# Patient Record
Sex: Female | Born: 1940
Health system: Southern US, Community
[De-identification: ages and names within clinical notes are randomized; demographics above are authoritative.]

## PROBLEM LIST (undated history)

## (undated) DIAGNOSIS — I4891 Unspecified atrial fibrillation: Secondary | ICD-10-CM

## (undated) DIAGNOSIS — J40 Bronchitis, not specified as acute or chronic: Secondary | ICD-10-CM

## (undated) DIAGNOSIS — L309 Dermatitis, unspecified: Secondary | ICD-10-CM

## (undated) DIAGNOSIS — M810 Age-related osteoporosis without current pathological fracture: Secondary | ICD-10-CM

## (undated) DIAGNOSIS — B372 Candidiasis of skin and nail: Secondary | ICD-10-CM

## (undated) DIAGNOSIS — M79604 Pain in right leg: Secondary | ICD-10-CM

## (undated) DIAGNOSIS — I517 Cardiomegaly: Secondary | ICD-10-CM

## (undated) DIAGNOSIS — Z8709 Personal history of other diseases of the respiratory system: Secondary | ICD-10-CM

## (undated) DIAGNOSIS — J0130 Acute sphenoidal sinusitis, unspecified: Secondary | ICD-10-CM

## (undated) DIAGNOSIS — R059 Cough, unspecified: Secondary | ICD-10-CM

## (undated) DIAGNOSIS — J301 Allergic rhinitis due to pollen: Secondary | ICD-10-CM

## (undated) DIAGNOSIS — R42 Dizziness and giddiness: Secondary | ICD-10-CM

## (undated) DIAGNOSIS — L299 Pruritus, unspecified: Secondary | ICD-10-CM

## (undated) DIAGNOSIS — M8580 Other specified disorders of bone density and structure, unspecified site: Secondary | ICD-10-CM

## (undated) DIAGNOSIS — R7303 Prediabetes: Secondary | ICD-10-CM

## (undated) DIAGNOSIS — R0602 Shortness of breath: Secondary | ICD-10-CM

## (undated) DIAGNOSIS — R109 Unspecified abdominal pain: Secondary | ICD-10-CM

## (undated) DIAGNOSIS — R9431 Abnormal electrocardiogram [ECG] [EKG]: Secondary | ICD-10-CM

## (undated) DIAGNOSIS — E782 Mixed hyperlipidemia: Secondary | ICD-10-CM

## (undated) DIAGNOSIS — I340 Nonrheumatic mitral (valve) insufficiency: Secondary | ICD-10-CM

## (undated) DIAGNOSIS — I6523 Occlusion and stenosis of bilateral carotid arteries: Secondary | ICD-10-CM

## (undated) DIAGNOSIS — R300 Dysuria: Secondary | ICD-10-CM

## (undated) DIAGNOSIS — M79605 Pain in left leg: Secondary | ICD-10-CM

## (undated) DIAGNOSIS — G8929 Other chronic pain: Secondary | ICD-10-CM

## (undated) DIAGNOSIS — R946 Abnormal results of thyroid function studies: Secondary | ICD-10-CM

## (undated) DIAGNOSIS — R319 Hematuria, unspecified: Secondary | ICD-10-CM

## (undated) DIAGNOSIS — Z6826 Body mass index (BMI) 26.0-26.9, adult: Secondary | ICD-10-CM

## (undated) DIAGNOSIS — R8290 Unspecified abnormal findings in urine: Secondary | ICD-10-CM

## (undated) DIAGNOSIS — E559 Vitamin D deficiency, unspecified: Secondary | ICD-10-CM

## (undated) DIAGNOSIS — H8110 Benign paroxysmal vertigo, unspecified ear: Secondary | ICD-10-CM

## (undated) DIAGNOSIS — R21 Rash and other nonspecific skin eruption: Secondary | ICD-10-CM

## (undated) DIAGNOSIS — I493 Ventricular premature depolarization: Secondary | ICD-10-CM

## (undated) DIAGNOSIS — E7800 Pure hypercholesterolemia, unspecified: Secondary | ICD-10-CM

## (undated) DIAGNOSIS — I10 Essential (primary) hypertension: Secondary | ICD-10-CM

## (undated) DIAGNOSIS — R0989 Other specified symptoms and signs involving the circulatory and respiratory systems: Secondary | ICD-10-CM

## (undated) DIAGNOSIS — E663 Overweight: Secondary | ICD-10-CM

## (undated) DIAGNOSIS — R079 Chest pain, unspecified: Secondary | ICD-10-CM

## (undated) DIAGNOSIS — I739 Peripheral vascular disease, unspecified: Secondary | ICD-10-CM

## (undated) DIAGNOSIS — R5383 Other fatigue: Secondary | ICD-10-CM

## (undated) DIAGNOSIS — R350 Frequency of micturition: Secondary | ICD-10-CM

## (undated) DIAGNOSIS — R002 Palpitations: Secondary | ICD-10-CM

## (undated) HISTORY — DX: Unspecified abdominal pain: R10.9

## (undated) HISTORY — DX: Pure hypercholesterolemia, unspecified: E78.00

## (undated) HISTORY — DX: Allergic rhinitis due to pollen: J30.1

## (undated) HISTORY — DX: Essential (primary) hypertension: I10

## (undated) HISTORY — DX: Overweight: E66.3

## (undated) HISTORY — DX: Mixed hyperlipidemia: E78.2

## (undated) HISTORY — PX: SHOULDER SURGERY: SHX246

## (undated) HISTORY — DX: Other specified symptoms and signs involving the circulatory and respiratory systems: R09.89

## (undated) HISTORY — DX: Candidiasis of skin and nail: B37.2

## (undated) HISTORY — DX: Age-related osteoporosis without current pathological fracture: M81.0

## (undated) HISTORY — DX: Dysuria: R30.0

## (undated) HISTORY — DX: Vitamin D deficiency, unspecified: E55.9

## (undated) HISTORY — DX: Prediabetes: R73.03

## (undated) HISTORY — PX: TUBAL LIGATION: SHX77

## (undated) HISTORY — DX: Occlusion and stenosis of bilateral carotid arteries: I65.23

## (undated) HISTORY — DX: Acute sphenoidal sinusitis, unspecified: J01.30

## (undated) HISTORY — DX: Unspecified atrial fibrillation: I48.91

## (undated) HISTORY — DX: Pain in left leg: M79.604

## (undated) HISTORY — DX: Rash and other nonspecific skin eruption: R21

## (undated) HISTORY — DX: Other fatigue: R53.83

## (undated) HISTORY — DX: Cardiomegaly: I51.7

## (undated) HISTORY — DX: Pain in right leg: M79.605

## (undated) HISTORY — DX: Pruritus, unspecified: L29.9

## (undated) HISTORY — DX: Unspecified abnormal findings in urine: R82.90

## (undated) HISTORY — DX: Body mass index (BMI) 26.0-26.9, adult: Z68.26

## (undated) HISTORY — DX: Cough, unspecified: R05.9

## (undated) HISTORY — DX: Nonrheumatic mitral (valve) insufficiency: I34.0

## (undated) HISTORY — DX: Shortness of breath: R06.02

## (undated) HISTORY — DX: Other specified disorders of bone density and structure, unspecified site: M85.80

## (undated) HISTORY — DX: Hematuria, unspecified: R31.9

## (undated) HISTORY — DX: Personal history of other diseases of the respiratory system: Z87.09

## (undated) HISTORY — DX: Abnormal results of thyroid function studies: R94.6

## (undated) HISTORY — DX: Ventricular premature depolarization: I49.3

## (undated) HISTORY — PX: CHOLECYSTECTOMY: SHX55

## (undated) HISTORY — DX: Frequency of micturition: R35.0

## (undated) HISTORY — DX: Benign paroxysmal vertigo, unspecified ear: H81.10

## (undated) HISTORY — DX: Dizziness and giddiness: R42

## (undated) HISTORY — PX: CARPAL TUNNEL RELEASE: SHX101

## (undated) HISTORY — DX: Chest pain, unspecified: R07.9

## (undated) HISTORY — DX: Peripheral vascular disease, unspecified: I73.9

## (undated) HISTORY — DX: Hypomagnesemia: E83.42

## (undated) HISTORY — DX: Bronchitis, not specified as acute or chronic: J40

## (undated) HISTORY — DX: Dermatitis, unspecified: L30.9

## (undated) HISTORY — DX: Palpitations: R00.2

## (undated) HISTORY — DX: Abnormal electrocardiogram (ECG) (EKG): R94.31

---

## 2018-04-30 ENCOUNTER — Encounter (HOSPITAL_COMMUNITY): Payer: Self-pay

## 2018-04-30 ENCOUNTER — Ambulatory Visit (HOSPITAL_COMMUNITY): Admit: 2018-04-30 | Payer: Self-pay | Admitting: Orthopedic Surgery

## 2018-04-30 SURGERY — ARTHROPLASTY, KNEE, TOTAL
Anesthesia: Spinal | Laterality: Right

## 2018-11-11 ENCOUNTER — Other Ambulatory Visit: Payer: Self-pay

## 2018-11-11 ENCOUNTER — Emergency Department (HOSPITAL_BASED_OUTPATIENT_CLINIC_OR_DEPARTMENT_OTHER): Payer: Medicare Other

## 2018-11-11 ENCOUNTER — Emergency Department (HOSPITAL_BASED_OUTPATIENT_CLINIC_OR_DEPARTMENT_OTHER)
Admission: EM | Admit: 2018-11-11 | Discharge: 2018-11-11 | Disposition: A | Discharge status: Home / Self Care | Payer: Medicare Other | Attending: Emergency Medicine | Admitting: Emergency Medicine

## 2018-11-11 ENCOUNTER — Encounter (HOSPITAL_BASED_OUTPATIENT_CLINIC_OR_DEPARTMENT_OTHER): Payer: Self-pay | Admitting: Emergency Medicine

## 2018-11-11 DIAGNOSIS — Z7982 Long term (current) use of aspirin: Secondary | ICD-10-CM | POA: Insufficient documentation

## 2018-11-11 DIAGNOSIS — Y929 Unspecified place or not applicable: Secondary | ICD-10-CM | POA: Diagnosis not present

## 2018-11-11 DIAGNOSIS — M545 Low back pain, unspecified: Secondary | ICD-10-CM

## 2018-11-11 DIAGNOSIS — Y999 Unspecified external cause status: Secondary | ICD-10-CM | POA: Insufficient documentation

## 2018-11-11 DIAGNOSIS — Y939 Activity, unspecified: Secondary | ICD-10-CM | POA: Insufficient documentation

## 2018-11-11 DIAGNOSIS — W19XXXA Unspecified fall, initial encounter: Secondary | ICD-10-CM | POA: Insufficient documentation

## 2018-11-11 DIAGNOSIS — Z79899 Other long term (current) drug therapy: Secondary | ICD-10-CM | POA: Diagnosis not present

## 2018-11-11 DIAGNOSIS — S22080A Wedge compression fracture of T11-T12 vertebra, initial encounter for closed fracture: Secondary | ICD-10-CM | POA: Insufficient documentation

## 2018-11-11 DIAGNOSIS — S3992XA Unspecified injury of lower back, initial encounter: Secondary | ICD-10-CM | POA: Diagnosis present

## 2018-11-11 HISTORY — DX: Other chronic pain: G89.29

## 2018-11-11 MED ORDER — HYDROCODONE-ACETAMINOPHEN 5-325 MG PO TABS
1.0000 | ORAL_TABLET | Freq: Once | ORAL | Status: AC
  Administered 2018-11-11: 1 via ORAL
  Filled 2018-11-11: qty 1

## 2018-11-11 MED ORDER — CYCLOBENZAPRINE HCL 10 MG PO TABS: 10.0000 mg | ORAL_TABLET | Freq: Two times a day (BID) | ORAL | 0 refills | Status: DC | PRN

## 2018-11-11 MED FILL — CYCLOBENZAPRINE HCL 10 MG T: 10 | 10 days supply | Qty: 20 | Fill #0

## 2018-11-11 NOTE — ED Triage Notes (Signed)
Pt states she fell today.  Pt states she has bad knees, and she fell outside.  Pt hit her lower back.  Pt c/o back pain.

## 2018-11-11 NOTE — ED Provider Notes (Signed)
[facility]ERGENCY DEPARTMENT Provider Note   CSN: [id number] Arrival date & time: 11/11/18  1406     History   Chief Complaint Chief Complaint  Patient presents with   Fall    HPI Shristi Noto is a 78 y.o. female.     HPI   [address] while trying to go up stairs to daughter's apartment, happened around [employer] backwards and landed on back on cement prior to getting to the steps. No head impact, no headache, no LOC, no neck pain, no numbness/weakness  Severe lower back pain, has not had anything for it, does take vicodin for chronic knee pains 7AM, worse with movements. Extends into upper buttock pain.    Past Medical History:  Diagnosis Date   Knee pain, chronic     There are no active problems to display for this patient.   Past Surgical History:  Procedure Laterality Date   CHOLECYSTECTOMY     TUBAL LIGATION       OB History   No obstetric history on file.      Home Medications    Prior to Admission medications   Medication Sig Start Date End Date Taking? Authorizing Provider  albuterol (PROAIR HFA) 108 (90 Base) MCG/ACT inhaler  06/21/17  Yes [provider]  aspirin EC 81 MG tablet Frequency:   Dosage:0.0     Instructions:  Note: 05/13/09  Yes [provider]  atorvastatin (LIPITOR) 20 MG tablet atorvastatin 20 mg tablet 05/23/14  Yes [provider]  diclofenac sodium (VOLTAREN) 1 % GEL Apply topically. 03/22/16 12/04/18 Yes [provider]  lisinopril-hydrochlorothiazide (ZESTORETIC) 20-12.5 MG tablet lisinopril 20 mg-hydrochlorothiazide 12.5 mg tablet 05/23/14  Yes [provider]  Cholecalciferol (VITAMIN D-1000 MAX ST) 25 MCG (1000 UT) tablet Take by mouth.    [provider]  cyclobenzaprine (FLEXERIL) 10 MG tablet Take 1 tablet (10 mg total) by mouth 2 (two) times daily as needed for muscle spasms. 11/11/18   Gareth Morgan, MD  HYDROcodone-acetaminophen (NORCO) 10-325 MG tablet Take  by mouth. 11/24/18 01/23/19  [provider]  lisinopril-hydrochlorothiazide (ZESTORETIC) 20-12.5 MG tablet Take 1 tablet by mouth daily. 09/04/18   [provider]    Family History History reviewed. No pertinent family history.  Social History Social History   Tobacco Use   Smoking status: Never Smoker   Smokeless tobacco: Never Used  Substance Use Topics   Alcohol use: Not on file   Drug use: Not on file     Allergies   Patient has no known allergies.   Review of Systems Review of Systems  Constitutional: Negative for fever.  Eyes: Negative for visual disturbance.  Respiratory: Negative for cough.   Cardiovascular: Negative for chest pain.  Gastrointestinal: Negative for abdominal pain.  Genitourinary: Negative for difficulty urinating.  Musculoskeletal: Positive for back pain. Negative for neck pain.  Neurological: Negative for syncope, weakness, numbness and headaches.     Physical Exam Updated Vital Signs BP (!) 132/92    Pulse 73    Temp 97.8 F (36.6 C) (Oral)    Resp 16    Ht 5\' 4"  (1.626 m)    Wt 72.6 kg    SpO2 97%    BMI 27.46 kg/m   Physical Exam Vitals signs and nursing note reviewed.  Constitutional:      General: She is not in acute distress.    Appearance: She is well-developed. She is not diaphoretic.  HENT:     Head: Normocephalic  and atraumatic.  Eyes:     Conjunctiva/sclera: Conjunctivae normal.  Neck:     Musculoskeletal: Normal range of motion.  Cardiovascular:     Rate and Rhythm: Normal rate and regular rhythm.  Pulmonary:     Effort: Pulmonary effort is normal. No respiratory distress.  Musculoskeletal:     Cervical back: She exhibits no tenderness and no bony tenderness.     Thoracic back: She exhibits no tenderness and no bony tenderness.     Lumbar back: She exhibits tenderness and bony tenderness.     Comments: Tenderness extending to sacrum and bilat iliac crest area   Skin:    General: Skin is warm and  dry.     Findings: No erythema or rash.  Neurological:     Mental Status: She is alert and oriented to person, place, and time.     Comments: Normal strength/sensation lower extremities      ED Treatments / Results  Labs (all labs ordered are listed, but only abnormal results are displayed) Labs Reviewed - No data to display  EKG None  Radiology Dg Lumbar Spine Complete  Result Date: 11/11/2018 CLINICAL DATA:  Pain status post fall EXAM: LUMBAR SPINE - COMPLETE 4+ VIEW COMPARISON:  None. FINDINGS: There is age-indeterminate height loss of the T12 vertebral body. Multilevel degenerative changes are noted throughout the lumbar spine, greatest at the lower lumbar segments were there is moderate facet arthrosis. Vascular calcifications are noted. IMPRESSION: 1. Age-indeterminate height loss of the T12 vertebral body. Findings are concerning for compression fracture in should be correlated with point tenderness. 2. Multilevel degenerative changes in the lumbar spine, greatest at the lower lumbar segments. Electronically Signed   By: Katherine Mantlehristopher  Green M.D.   On: 11/11/2018 16:51   Dg Pelvis 1-2 Views  Result Date: 11/11/2018 CLINICAL DATA:  Fall, pelvic L-spine pain EXAM: PELVIS - 1-2 VIEW COMPARISON:  None. FINDINGS: The osseous structures appear diffusely demineralized which may limit detection of small or nondisplaced fractures. No acute bony abnormality. Specifically, no fracture, subluxation, or dislocation. Mild degenerative changes in the spine, SI joints and both hips. Phleboliths in the pelvis. No obstructive bowel gas pattern. Soft tissues are otherwise unremarkable. IMPRESSION: The osseous structures appear diffusely demineralized which may limit detection of small or nondisplaced fractures. No acute osseous abnormality. Electronically Signed   By: Kreg ShropshirePrice  DeHay M.D.   On: 11/11/2018 16:51    Procedures Procedures (including critical care time)  Medications Ordered in  ED Medications  HYDROcodone-acetaminophen (NORCO/VICODIN) 5-325 MG per tablet 1 tablet (1 tablet Oral Given 11/11/18 1551)     Initial Impression / Assessment and Plan / ED Course  I have reviewed the triage vital signs and the nursing notes.  Pertinent labs & imaging results that were available during my care of the patient were reviewed by me and considered in my medical decision making (see chart for details).        78 year old female with a history of hypertension, knee pain, presents with concern for mechanical fall with back pain.  Is not on anticoagulation, no head trauma, no headaches, have low suspicion for intracranial injury.  No neck pain.   No numbness or weakness, doubt spinal cord injury, CVA.  Reports pain in her lower back and some pelvic pain, XR obtained shows age indeterminate T12 fracture. She is able to ambulate/bear weight, doubt occult pelvic fx at this time. Most of her significant tenderness is located lower than this(L5)  however she does have some  tenderness beginning in this area and it is possible this is acute.  Given osteoporotic compression fx, age indeterminate, do not feel there will be significant benefit to bracing.  Pt had rx for norco, reports she is only taking BID and it is written for TID, recommend increasing frequency for pain and discussing further pain control with PCP. Given number for spinal surgery for follow up. Patient discharged in stable condition with understanding of reasons to return.    Final Clinical Impressions(s) / ED Diagnoses   Final diagnoses:  Fall, initial encounter  Acute bilateral low back pain without sciatica  Compression fracture of T12 vertebra, initial encounter [facility])    ED Discharge Orders         Ordered    cyclobenzaprine (FLEXERIL) 10 MG tablet  2 times daily PRN     11/11/18 1705           Alvira Monday, MD 11/12/18 [phone]

## 2018-11-11 NOTE — Discharge Instructions (Signed)
Your XR shows an age indeterminate compression fracture of T12.  You hare having some pain in this area, however you are having more significant pain lower and it is not clear if this fracture is new or old. In the setting of new back pain and some tenderness to this area, however, I cannot rule out that this is a new fracture.  You have been prescribed norco by your physician and have been taking twice a day rather than 3 times daily, and feel it is reasonable to increase frequency and discuss further pain control with your primary care physician and follow up with the spine doctors. I also recommend the prescribed muscle relaxant as there is likely a muscular component of your pain as well.

## 2018-11-11 NOTE — ED Notes (Signed)
ED Provider at bedside. 

## 2020-02-16 IMAGING — DX DG PELVIS 1-2V
1 series · 1 of 1 positions shown · non-contrast
Comparison: None.

CLINICAL DATA: Fall, pelvic L-spine pain

EXAM:
PELVIS - 1-2 VIEW

[pelvis ap]
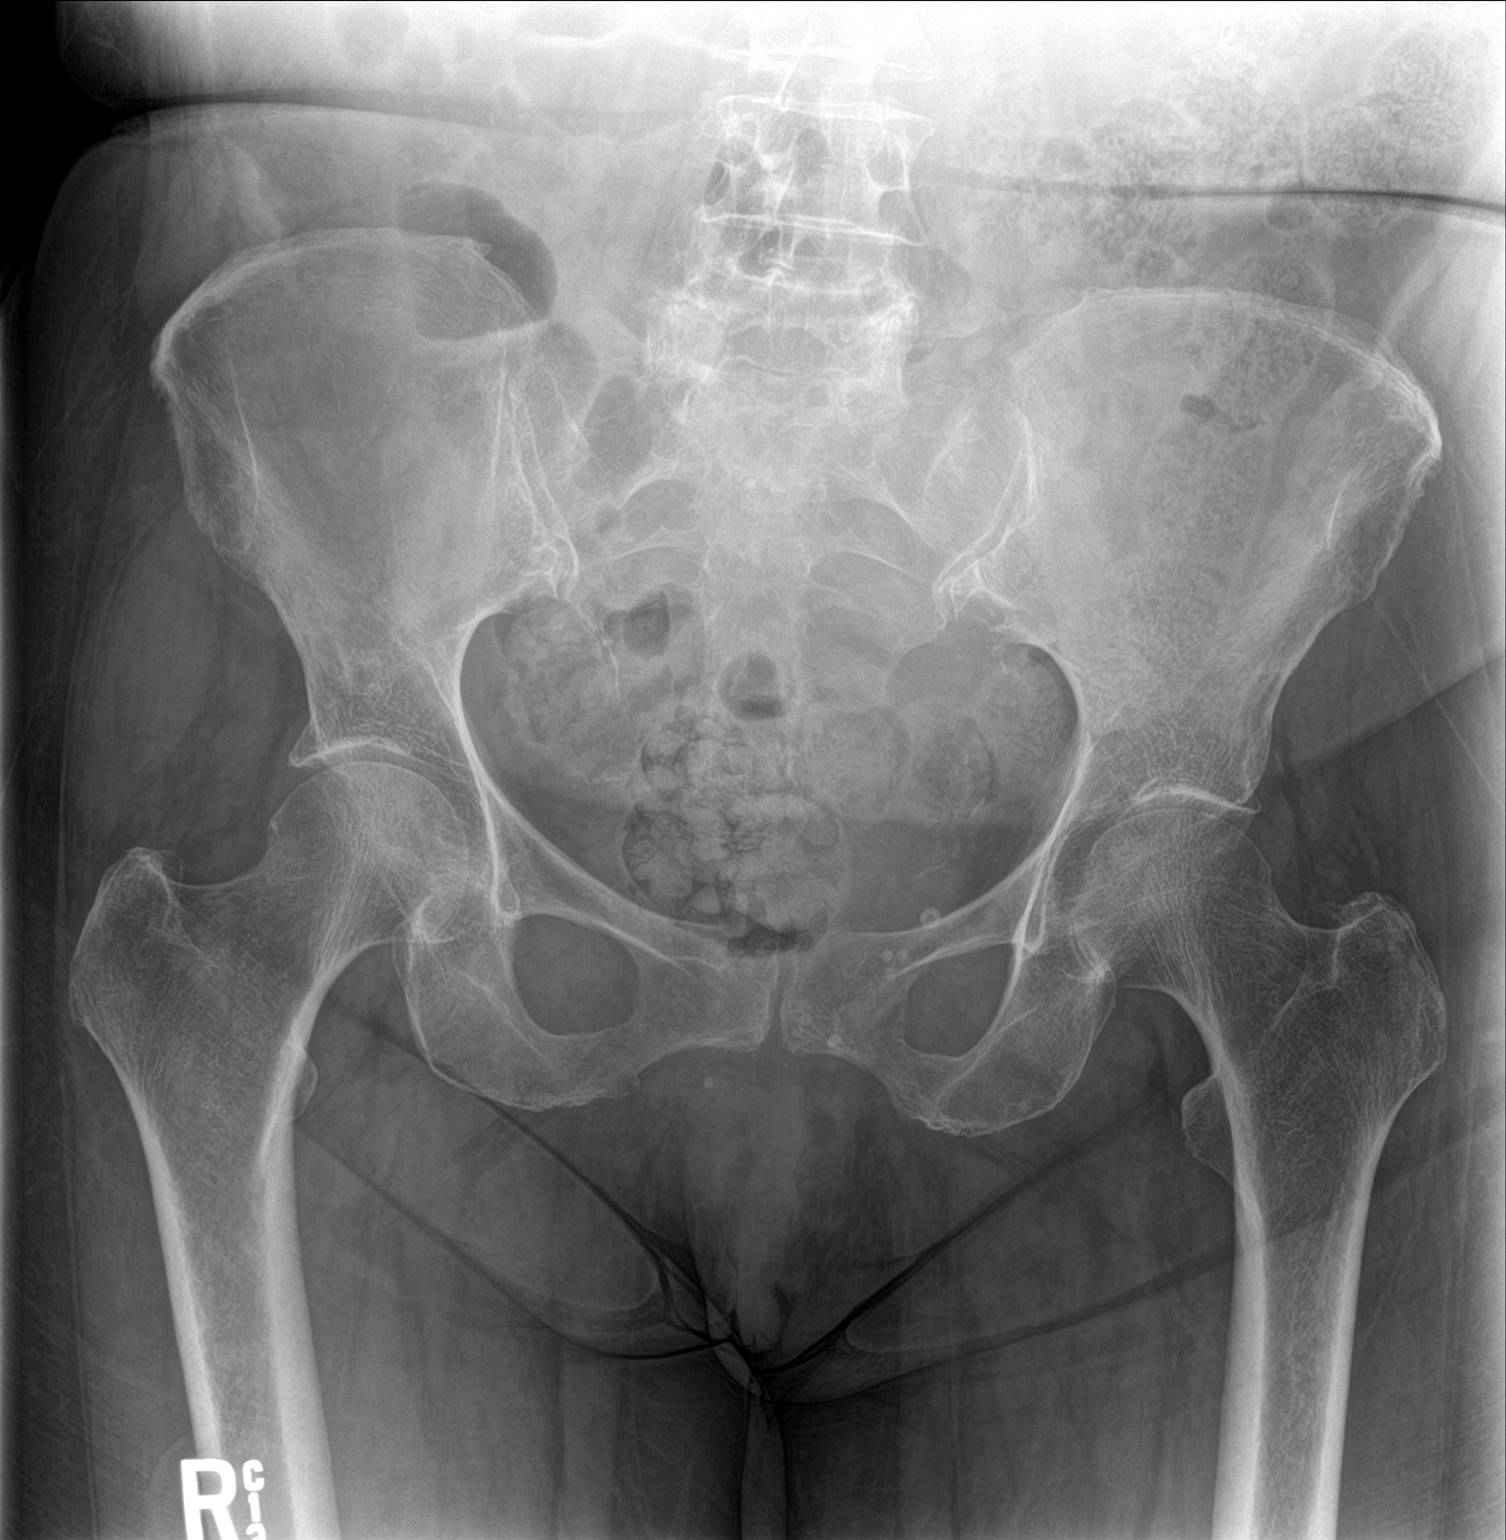

[1 of 1 positions shown; findings below may reference images not displayed]

FINDINGS: The osseous structures appear diffusely demineralized which may
limit detection of small or nondisplaced fractures. No acute bony
abnormality. Specifically, no fracture, subluxation, or dislocation.
Mild degenerative changes in the spine, SI joints and both hips.
Phleboliths in the pelvis. No obstructive bowel gas pattern. Soft
tissues are otherwise unremarkable.
IMPRESSION: The osseous structures appear diffusely demineralized which may
limit detection of small or nondisplaced fractures.

No acute osseous abnormality.

## 2022-05-17 ENCOUNTER — Encounter: Payer: Self-pay | Admitting: Cardiovascular Disease

## 2022-05-17 ENCOUNTER — Ambulatory Visit: Payer: Medicare Other | Attending: Cardiovascular Disease | Admitting: Cardiovascular Disease

## 2022-05-17 VITALS — BP 128/82 | HR 67 | Ht 64.0 in | Wt 151.2 lb

## 2022-05-17 DIAGNOSIS — I4819 Other persistent atrial fibrillation: Secondary | ICD-10-CM

## 2022-05-17 NOTE — Patient Instructions (Signed)
Medication Instructions:  Your physician recommends that you continue on your current medications as directed. Please refer to the Current Medication list given to you today.  *If you need a refill on your cardiac medications before your next appointment, please call your pharmacy*   Lab Work: CBC and BMET TODAY If you have labs (blood work) drawn today and your tests are completely normal, you will receive your results only by: MyChart Message (if you have MyChart) OR A paper copy in the mail If you have any lab test that is abnormal or we need to change your treatment, we will call you to review the results.   Testing/Procedures: Cardioversion - waiting on labs to review possible dose increase of Eliquis - then cardioversion in 1 month after dose increase if possible  Your physician has recommended that you have a Cardioversion (DCCV). Electrical Cardioversion uses a jolt of electricity to your heart either through paddles or wired patches attached to your chest. This is a controlled, usually prescheduled, procedure. Defibrillation is done under light anesthesia in the hospital, and you usually go home the day of the procedure. This is done to get your heart back into a normal rhythm. You are not awake for the procedure. Please see the instruction sheet given to you today.    Follow-Up: At [facility], you and your health needs are our priority.  As part of our continuing mission to provide you with exceptional heart care, we have created designated Provider Care Teams.  These Care Teams include your primary Cardiologist (physician) and Advanced Practice Providers (APPs -  Physician Assistants and Nurse Practitioners) who all work together to provide you with the care you need, when you need it.  We recommend signing up for the patient portal called "MyChart".  Sign up information is provided on this After Visit Summary.  MyChart is used to connect with patients for Virtual Visits  (Telemedicine).  Patients are able to view lab/test results, encounter notes, upcoming appointments, etc.  Non-urgent messages can be sent to your provider as well.   To learn more about what you can do with MyChart, go to [email or web address].    Your next appointment:   After cardioversion  Provider:   York Pellant, MD

## 2022-05-17 NOTE — Progress Notes (Signed)
Electrophysiology Office Note:    Date:  05/17/2022   ID:  Anna Roy, DOB 02/18/1940, MRN [id number]  PCP:  Caffie Damme, MD   [facility] Providers Cardiologist:  None     Referring MD: Sherrill Raring, MD   History of Present Illness:    Anna Roy is a 82 y.o. female with a hx listed below, significant for hypertension and hyperlipidemia, referred for arrhythmia management.  She was seen by her PCP for an annual checkup in January.  EKG at that time showed atrial fibrillation.  She recalls it had been approximately a year since her prior physician visit.  Patient was referred to cardiology and saw Dr. Hanley Hays with [facility] integrative cardiology in [facility].  Performed an echocardiogram, month-long monitor, and stress test.  There was no underlying structural heart disease detected other than that her left atrium is severely dilated.  I do not have the final report from the monitor, just a few transmissions with brief pauses in atrial fibrillation.  The patient is completely unaware of her atrial fibrillation.  She has not noticed onset of or increased fatigue.  She does not have palpitations.  She has not had any episodes of lightheadedness or presyncope.     Past Medical History:  Diagnosis Date   Abdominal pain    Abnormal EKG    Abnormal thyroid function test    Abnormal urine    Acute sphenoidal sinusitis, recurrence not specified    Allergic rhinitis due to pollen, unspecified seasonality    Atrial fibrillation by electrocardiogram (HCC)    BMI 26.0-26.9,adult    BPPV (benign paroxysmal positional vertigo)    Bronchitis    Bruit    Candidal intertrigo    Cardiomegaly    Carotid stenosis, bilateral    Chest pain    Cough    Dermatitis    Dizziness and giddiness    Dysuria    Fatigue    Hematuria    History of influenza    HTN (hypertension)    Hypercholesteremia    Hypomagnesemia    Knee pain, chronic    Leg pain, bilateral    LVH (left  ventricular hypertrophy)    Mitral regurgitation    Mixed hyperlipidemia    Osteopenia    Osteoporosis    Overweight    Palpitation    Prediabetes    Pruritus    PVC (premature ventricular contraction)    PVD (peripheral vascular disease) (HCC)    Rash    SOB (shortness of breath) on exertion    Urinary frequency    Vitamin D deficiency    Vitamin D deficiency     Past Surgical History:  Procedure Laterality Date   CARPAL TUNNEL RELEASE     CHOLECYSTECTOMY     SHOULDER SURGERY     TUBAL LIGATION      Current Medications: Current Meds  Medication Sig   apixaban (ELIQUIS) 2.5 MG TABS tablet Take by mouth 2 (two) times daily.   aspirin EC 81 MG tablet Frequency:   Dosage:0.0     Instructions:  Note:   atorvastatin (LIPITOR) 20 MG tablet atorvastatin 20 mg tablet   Cholecalciferol (VITAMIN D-1000 MAX ST) 25 MCG (1000 UT) tablet Take by mouth.   [facility] LORATADINE PO Take 10 mg by mouth daily as needed.   diclofenac Sodium (VOLTAREN ARTHRITIS PAIN) 1 % GEL Apply 2 g topically 4 (four) times daily.   fluticasone (FLONASE) 50 MCG/ACT nasal spray Place 1 spray  into both nostrils daily.   HYDROcodone-acetaminophen (NORCO) 10-325 MG tablet Take 1 tablet by mouth in the morning, at noon, and at bedtime.   lisinopril-hydrochlorothiazide (ZESTORETIC) 20-12.5 MG tablet lisinopril 20 mg-hydrochlorothiazide 12.5 mg tablet   lisinopril-hydrochlorothiazide (ZESTORETIC) 20-12.5 MG tablet Take 1 tablet by mouth daily.   Vitamin D, Ergocalciferol, (DRISDOL) 1.25 MG (50000 UNIT) CAPS capsule Take 50,000 Units by mouth every 7 (seven) days.   [DISCONTINUED] albuterol (PROAIR HFA) 108 (90 Base) MCG/ACT inhaler    [DISCONTINUED] cyclobenzaprine (FLEXERIL) 10 MG tablet Take 1 tablet (10 mg total) by mouth 2 (two) times daily as needed for muscle spasms.   [DISCONTINUED] gabapentin (NEURONTIN) 300 MG capsule Take 300 mg by mouth at bedtime.   [DISCONTINUED] Magnesium Oxide 250 MG TABS Take by mouth.    [DISCONTINUED] tizanidine (ZANAFLEX) 2 MG capsule Take 2 mg by mouth 3 (three) times daily as needed for muscle spasms.     Allergies:   Patient has no known allergies.   Social and Family History: Reviewed in Epic  ROS:   Please see the history of present illness.    All other systems reviewed and are negative.  EKGs/Labs/Other Studies Reviewed Today:    Echocardiogram:  TTE - per [facility] referral records EF 65-70%, left atrium is markedly dilated, 5.1 cm, otherwise normal anatomy and function   Monitors:  MCT  2/19-3/19, 2024 The patient has brought print outs of a few critical events from the monitor that show AF and pauses of up to 3.5 seconds occurring in the AM; one occurred at 7:45. The others before 5 AM.  Stress testing:   Advanced imaging:    EKG:  Last EKG results: today - atrial fibrillation with controlled ventricular rates   Recent Labs: No results found for requested labs within last 365 days.     Physical Exam:    VS:  BP 128/82   Pulse 67   Ht 5\' 4"  (1.626 m)   Wt 151 lb 3.2 oz (68.6 kg)   SpO2 95%   BMI 25.95 kg/m     Wt Readings from Last 3 Encounters:  05/17/22 151 lb 3.2 oz (68.6 kg)  11/11/18 160 lb (72.6 kg)     GEN: Well nourished, well developed in no acute distress CARDIAC: iRRR, no murmurs, rubs, gallops RESPIRATORY:  Normal work of breathing MUSCULOSKELETAL: no edema    ASSESSMENT & PLAN:    Atrial fibrillation - persistent No palpitations.  Rates are controlled Atrium is markedly dilated; AF is fairly fine on ECG I suspect she has had AF for much longer than has been appreciated I think a trial of sinus rhythm is reasonable, since she has never had an attempted cardioversion.   Secondary hypercoagulable state Will check renal function. Unless her creatinine is > 1.5, she should be on 5mg  of Eliquis BID She will need to be adequately anticoagulated for 3 weeks prior to cardioversion  Brief  pauses Pauses in AF are < 6 seconds, and the patient has not had symptoms of bradycardia No indication for pacemaker at this time.           Medication Adjustments/Labs and Tests Ordered: Current medicines are reviewed at length with the patient today.  Concerns regarding medicines are outlined above.  Orders Placed This Encounter  Procedures   CBC   Basic metabolic panel   EKG 12-Lead   No orders of the defined types were placed in this encounter.    Signed, Roberts Gaudy Trellis Guirguis,  MD  05/17/2022 4:52 PM    [facility]

## 2022-05-18 LAB — BASIC METABOLIC PANEL
BUN/Creatinine Ratio: 13 (ref 12–28)
BUN: 8 mg/dL (ref 8–27)
CO2: 24 mmol/L (ref 20–29)
Calcium: 10.1 mg/dL (ref 8.7–10.3)
Chloride: 103 mmol/L (ref 96–106)
Creatinine, Ser: 0.64 mg/dL (ref 0.57–1.00)
Glucose: 91 mg/dL (ref 70–99)
Potassium: 3.8 mmol/L (ref 3.5–5.2)
Sodium: 142 mmol/L (ref 134–144)
eGFR: 88 mL/min/{1.73_m2} (ref >59)

## 2022-05-18 LAB — CBC
Hematocrit: 39.2 % (ref 34.0–46.6)
Hemoglobin: 12.8 g/dL (ref 11.1–15.9)
MCH: 29.3 pg (ref 26.6–33.0)
MCHC: 32.7 g/dL (ref 31.5–35.7)
MCV: 90 fL (ref 79–97)
Platelets: 232 10*3/uL (ref 150–450)
RBC: 4.37 x10E6/uL (ref 3.77–5.28)
RDW: 12.8 % (ref 11.7–15.4)
WBC: 6.7 10*3/uL (ref 3.4–10.8)

## 2022-05-19 ENCOUNTER — Telehealth: Payer: Self-pay

## 2022-05-19 MED ORDER — APIXABAN 5 MG PO TABS: 5.0000 mg | ORAL_TABLET | Freq: Two times a day (BID) | ORAL | 11 refills | Status: AC

## 2022-05-19 NOTE — Telephone Encounter (Signed)
Left message to call back - labs look good, need to increase Eliquis (see Dr Zoe Lan note from last office visit) - also needs to be scheduled for DCCV 1 month after increase, confirm dates available

## 2022-05-19 NOTE — Telephone Encounter (Signed)
Spoke with patients daughter Harriett Sine, [place] on file), lab work looks good, sent in Eliquis 5mg  prescription to [facility] in [place] as requested, scheduled cardioversion for 06/22/22.    Per Dr. Morrie Sheldon note from office visit on 05/17/22 Will check renal function. Unless her creatinine is > 1.5, she should be on 5mg  of Eliquis BID She will need to be adequately anticoagulated for 3 weeks prior to cardioversion

## 2022-06-16 ENCOUNTER — Ambulatory Visit: Payer: Medicare Other | Attending: Cardiovascular Disease | Admitting: Cardiovascular Disease

## 2022-06-16 ENCOUNTER — Encounter: Payer: Self-pay | Admitting: Cardiovascular Disease

## 2022-06-16 VITALS — BP 110/80 | HR 80 | Ht 64.0 in | Wt 150.8 lb

## 2022-06-16 DIAGNOSIS — I4891 Unspecified atrial fibrillation: Secondary | ICD-10-CM | POA: Diagnosis not present

## 2022-06-16 NOTE — Progress Notes (Signed)
Electrophysiology Office Note:    Date:  06/16/2022   ID:  Anna Roy, DOB 1940-12-26, MRN [id number]  PCP:  Caffie Damme, MD   [facility] Providers Cardiologist:  None     Referring MD: Caffie Damme, MD   History of Present Illness:    Anna Roy is a 82 y.o. female with a hx listed below, significant for hypertension and hyperlipidemia, referred for arrhythmia management.  She was seen by her PCP for an annual checkup in January.  EKG at that time showed atrial fibrillation.  She recalls it had been approximately a year since her prior physician visit.  Patient was referred to cardiology and saw Dr. Hanley Hays with [facility] integrative cardiology in [facility].  Performed an echocardiogram, month-long monitor, and stress test.  There was no underlying structural heart disease detected other than that her left atrium is severely dilated.  I do not have the final report from the monitor, just a few transmissions with brief pauses in atrial fibrillation.  The patient is completely unaware of her atrial fibrillation.  She has not noticed onset of or increased fatigue.  She does not have palpitations.  She has not had any episodes of lightheadedness or presyncope.  She is here for a pre-procedure visit. She is scheduled for cardioversion on June 6. She has not had any changes in diagnoses, symptoms, or medications.   Past Medical History:  Diagnosis Date   Abdominal pain    Abnormal EKG    Abnormal thyroid function test    Abnormal urine    Acute sphenoidal sinusitis, recurrence not specified    Allergic rhinitis due to pollen, unspecified seasonality    Atrial fibrillation by electrocardiogram (HCC)    BMI 26.0-26.9,adult    BPPV (benign paroxysmal positional vertigo)    Bronchitis    Bruit    Candidal intertrigo    Cardiomegaly    Carotid stenosis, bilateral    Chest pain    Cough    Dermatitis    Dizziness and giddiness    Dysuria    Fatigue    Hematuria     History of influenza    HTN (hypertension)    Hypercholesteremia    Hypomagnesemia    Knee pain, chronic    Leg pain, bilateral    LVH (left ventricular hypertrophy)    Mitral regurgitation    Mixed hyperlipidemia    Osteopenia    Osteoporosis    Overweight    Palpitation    Prediabetes    Pruritus    PVC (premature ventricular contraction)    PVD (peripheral vascular disease) (HCC)    Rash    SOB (shortness of breath) on exertion    Urinary frequency    Vitamin D deficiency    Vitamin D deficiency     Past Surgical History:  Procedure Laterality Date   CARPAL TUNNEL RELEASE     CHOLECYSTECTOMY     SHOULDER SURGERY     TUBAL LIGATION      Current Medications: Current Meds  Medication Sig   apixaban (ELIQUIS) 5 MG TABS tablet Take 1 tablet (5 mg total) by mouth 2 (two) times daily.   aspirin EC 81 MG tablet Frequency:   Dosage:0.0     Instructions:  Note:   atorvastatin (LIPITOR) 20 MG tablet atorvastatin 20 mg tablet   Cholecalciferol (VITAMIN D-1000 MAX ST) 25 MCG (1000 UT) tablet Take by mouth.   [employer] LORATADINE PO Take 10 mg by mouth daily as needed.  diclofenac Sodium (VOLTAREN ARTHRITIS PAIN) 1 % GEL Apply 2 g topically 4 (four) times daily.   fluticasone (FLONASE) 50 MCG/ACT nasal spray Place 1 spray into both nostrils daily.   HYDROcodone-acetaminophen (NORCO) 10-325 MG tablet Take 1 tablet by mouth in the morning, at noon, and at bedtime.   lisinopril-hydrochlorothiazide (ZESTORETIC) 20-12.5 MG tablet lisinopril 20 mg-hydrochlorothiazide 12.5 mg tablet   lisinopril-hydrochlorothiazide (ZESTORETIC) 20-12.5 MG tablet Take 1 tablet by mouth daily.   Vitamin D, Ergocalciferol, (DRISDOL) 1.25 MG (50000 UNIT) CAPS capsule Take 50,000 Units by mouth every 7 (seven) days.     Allergies:   Patient has no known allergies.   Social and Family History: Reviewed in Epic  ROS:   Please see the history of present illness.    All other systems reviewed and are  negative.  EKGs/Labs/Other Studies Reviewed Today:    Echocardiogram:  TTE - per [facility] referral records EF 65-70%, left atrium is markedly dilated, 5.1 cm, otherwise normal anatomy and function   Monitors:  MCT  2/19-3/19, 2024 The patient has brought print outs of a few critical events from the monitor that show AF and pauses of up to 3.5 seconds occurring in the AM; one occurred at 7:45. The others before 5 AM.  Stress testing:   Advanced imaging:    EKG:  Last EKG results: today - atrial fibrillation with controlled ventricular rates   Recent Labs: 05/17/2022: BUN 8; Creatinine, Ser 0.64; Hemoglobin 12.8; Platelets 232; Potassium 3.8; Sodium 142     Physical Exam:    VS:  BP 110/80 (BP Location: Left Arm, Patient Position: Sitting, Cuff Size: Normal)   Pulse 80   Ht 5\' 4"  (1.626 m)   Wt 150 lb 12.8 oz (68.4 kg)   SpO2 96%   BMI 25.88 kg/m     Wt Readings from Last 3 Encounters:  06/16/22 150 lb 12.8 oz (68.4 kg)  05/17/22 151 lb 3.2 oz (68.6 kg)  11/11/18 160 lb (72.6 kg)     GEN: Well nourished, well developed in no acute distress CARDIAC: iRRR, no murmurs, rubs, gallops RESPIRATORY:  Normal work of breathing MUSCULOSKELETAL: no edema    ASSESSMENT & PLAN:    Atrial fibrillation - persistent No palpitations.  Rates are controlled Atrium is markedly dilated; AF is fairly fine on ECG I suspect she has had AF for much longer than has been appreciated I think a trial of sinus rhythm is reasonable, since she has never had an attempted cardioversion. She is scheduled for cardioversion  Secondary hypercoagulable state Eliquis 5mg    Brief pauses Pauses in AF are < 6 seconds, and the patient has not had symptoms of bradycardia No indication for pacemaker at this time.       Medication Adjustments/Labs and Tests Ordered: Current medicines are reviewed at length with the patient today.  Concerns regarding medicines are outlined above.   No orders of the defined types were placed in this encounter.  No orders of the defined types were placed in this encounter.    Signed, Maurice Small, MD  06/16/2022 3:13 PM    [facility]

## 2022-06-16 NOTE — Patient Instructions (Addendum)
Medication Instructions:  Your physician recommends that you continue on your current medications as directed. Please refer to the Current Medication list given to you today.  *If you need a refill on your cardiac medications before your next appointment, please call your pharmacy*   Follow-Up: At [facility], you and your health needs are our priority.  As part of our continuing mission to provide you with exceptional heart care, we have created designated Provider Care Teams.  These Care Teams include your primary Cardiologist (physician) and Advanced Practice Providers (APPs -  Physician Assistants and Nurse Practitioners) who all work together to provide you with the care you need, when you need it.  We recommend signing up for the patient portal called "MyChart".  Sign up information is provided on this After Visit Summary.  MyChart is used to connect with patients for Virtual Visits (Telemedicine).  Patients are able to view lab/test results, encounter notes, upcoming appointments, etc.  Non-urgent messages can be sent to your provider as well.   To learn more about what you can do with MyChart, go to [email or web address].    Your next appointment:   1 month  Provider:   York Pellant, MD

## 2022-06-16 NOTE — H&P (View-Only) (Signed)
Electrophysiology Office Note:    Date:  06/16/2022   ID:  Anna Roy, DOB 04/11/1940, MRN [id number]  PCP:  Smith, Karla, MD   [facility] Providers Cardiologist:  None     Referring MD: Smith, Karla, MD   History of Present Illness:    Anna Roy is a 82 y.o. female with a hx listed below, significant for hypertension and hyperlipidemia, referred for arrhythmia management.  She was seen by her PCP for an annual checkup in January.  EKG at that time showed atrial fibrillation.  She recalls it had been approximately a year since her prior physician visit.  Patient was referred to cardiology and saw Dr. Rosario with [facility] cardiology in [place].  Performed an echocardiogram, month-long monitor, and stress test.  There was no underlying structural heart disease detected other than that her left atrium is severely dilated.  I do not have the final report from the monitor, just a few transmissions with brief pauses in atrial fibrillation.  The patient is completely unaware of her atrial fibrillation.  She has not noticed onset of or increased fatigue.  She does not have palpitations.  She has not had any episodes of lightheadedness or presyncope.  She is here for a pre-procedure visit. She is scheduled for cardioversion on June 6. She has not had any changes in diagnoses, symptoms, or medications.   Past Medical History:  Diagnosis Date   Abdominal pain    Abnormal EKG    Abnormal thyroid function test    Abnormal urine    Acute sphenoidal sinusitis, recurrence not specified    Allergic rhinitis due to pollen, unspecified seasonality    Atrial fibrillation by electrocardiogram (HCC)    BMI 26.0-26.9,adult    BPPV (benign paroxysmal positional vertigo)    Bronchitis    Bruit    Candidal intertrigo    Cardiomegaly    Carotid stenosis, bilateral    Chest pain    Cough    Dermatitis    Dizziness and giddiness    Dysuria    Fatigue    Hematuria     History of influenza    HTN (hypertension)    Hypercholesteremia    Hypomagnesemia    Knee pain, chronic    Leg pain, bilateral    LVH (left ventricular hypertrophy)    Mitral regurgitation    Mixed hyperlipidemia    Osteopenia    Osteoporosis    Overweight    Palpitation    Prediabetes    Pruritus    PVC (premature ventricular contraction)    PVD (peripheral vascular disease) (HCC)    Rash    SOB (shortness of breath) on exertion    Urinary frequency    Vitamin D deficiency    Vitamin D deficiency     Past Surgical History:  Procedure Laterality Date   CARPAL TUNNEL RELEASE     CHOLECYSTECTOMY     SHOULDER SURGERY     TUBAL LIGATION      Current Medications: Current Meds  Medication Sig   apixaban (ELIQUIS) 5 MG TABS tablet Take 1 tablet (5 mg total) by mouth 2 (two) times daily.   aspirin EC 81 MG tablet Frequency:   Dosage:0.0     Instructions:  Note:   atorvastatin (LIPITOR) 20 MG tablet atorvastatin 20 mg tablet   Cholecalciferol (VITAMIN D-1000 MAX ST) 25 MCG (1000 UT) tablet Take by mouth.   [employer] LORATADINE PO Take 10 mg by mouth daily as needed.     diclofenac Sodium (VOLTAREN ARTHRITIS PAIN) 1 % GEL Apply 2 g topically 4 (four) times daily.   fluticasone (FLONASE) 50 MCG/ACT nasal spray Place 1 spray into both nostrils daily.   HYDROcodone-acetaminophen (NORCO) 10-325 MG tablet Take 1 tablet by mouth in the morning, at noon, and at bedtime.   lisinopril-hydrochlorothiazide (ZESTORETIC) 20-12.5 MG tablet lisinopril 20 mg-hydrochlorothiazide 12.5 mg tablet   lisinopril-hydrochlorothiazide (ZESTORETIC) 20-12.5 MG tablet Take 1 tablet by mouth daily.   Vitamin D, Ergocalciferol, (DRISDOL) 1.25 MG (50000 UNIT) CAPS capsule Take 50,000 Units by mouth every 7 (seven) days.     Allergies:   Patient has no known allergies.   Social and Family History: Reviewed in Epic  ROS:   Please see the history of present illness.    All other systems reviewed and are  negative.  EKGs/Labs/Other Studies Reviewed Today:    Echocardiogram:  TTE - per [facility] referral records EF 65-70%, left atrium is markedly dilated, 5.1 cm, otherwise normal anatomy and function   Monitors:  MCT  2/19-3/19, 2024 The patient has brought print outs of a few critical events from the monitor that show AF and pauses of up to 3.5 seconds occurring in the AM; one occurred at 7:45. The others before 5 AM.  Stress testing:   Advanced imaging:    EKG:  Last EKG results: today - atrial fibrillation with controlled ventricular rates   Recent Labs: 05/17/2022: BUN 8; Creatinine, Ser 0.64; Hemoglobin 12.8; Platelets 232; Potassium 3.8; Sodium 142     Physical Exam:    VS:  BP 110/80 (BP Location: Left Arm, Patient Position: Sitting, Cuff Size: Normal)   Pulse 80   Ht 5' 4" (1.626 m)   Wt 150 lb 12.8 oz (68.4 kg)   SpO2 96%   BMI 25.88 kg/m     Wt Readings from Last 3 Encounters:  06/16/22 150 lb 12.8 oz (68.4 kg)  05/17/22 151 lb 3.2 oz (68.6 kg)  11/11/18 160 lb (72.6 kg)     GEN: Well nourished, well developed in no acute distress CARDIAC: iRRR, no murmurs, rubs, gallops RESPIRATORY:  Normal work of breathing MUSCULOSKELETAL: no edema    ASSESSMENT & PLAN:    Atrial fibrillation - persistent No palpitations.  Rates are controlled Atrium is markedly dilated; AF is fairly fine on ECG I suspect she has had AF for much longer than has been appreciated I think a trial of sinus rhythm is reasonable, since she has never had an attempted cardioversion. She is scheduled for cardioversion  Secondary hypercoagulable state Eliquis 5mg   Brief pauses Pauses in AF are < 6 seconds, and the patient has not had symptoms of bradycardia No indication for pacemaker at this time.       Medication Adjustments/Labs and Tests Ordered: Current medicines are reviewed at length with the patient today.  Concerns regarding medicines are outlined above.   No orders of the defined types were placed in this encounter.  No orders of the defined types were placed in this encounter.    Signed, Shazia Mitchener E Mervyn Pflaum, MD  06/16/2022 3:13 PM    [facility] 

## 2022-06-21 NOTE — Pre-Procedure Instructions (Signed)
Spoke with patient and daughter, Harriett Sine, on speaker phone regarding cardioversion tomorrow - instructed patient to arrive at 0900, NPO after midnight, confirmed ride home and responsible person to stay with patient for 24 hours, confirmed no missed doses of Eliquis, instructed to take AM pills with sip of water

## 2022-06-22 ENCOUNTER — Ambulatory Visit (HOSPITAL_BASED_OUTPATIENT_CLINIC_OR_DEPARTMENT_OTHER): Payer: Medicare Other | Admitting: Certified Registered Nurse Anesthetist

## 2022-06-22 ENCOUNTER — Ambulatory Visit (HOSPITAL_COMMUNITY)
Admission: RE | Admit: 2022-06-22 | Discharge: 2022-06-22 | Disposition: A | Discharge status: Home / Self Care | Payer: Medicare Other | Attending: Cardiology | Admitting: Cardiology

## 2022-06-22 ENCOUNTER — Encounter (HOSPITAL_COMMUNITY): Payer: Self-pay | Admitting: Cardiology

## 2022-06-22 ENCOUNTER — Other Ambulatory Visit: Payer: Self-pay

## 2022-06-22 ENCOUNTER — Ambulatory Visit (HOSPITAL_COMMUNITY): Payer: Medicare Other | Admitting: Certified Registered Nurse Anesthetist

## 2022-06-22 ENCOUNTER — Encounter (HOSPITAL_COMMUNITY)
Admission: RE | Disposition: A | Discharge status: Home / Self Care | Payer: Self-pay | Source: Home / Self Care | Attending: Cardiology

## 2022-06-22 DIAGNOSIS — I4891 Unspecified atrial fibrillation: Secondary | ICD-10-CM

## 2022-06-22 DIAGNOSIS — I739 Peripheral vascular disease, unspecified: Secondary | ICD-10-CM

## 2022-06-22 DIAGNOSIS — I10 Essential (primary) hypertension: Secondary | ICD-10-CM | POA: Diagnosis not present

## 2022-06-22 DIAGNOSIS — E785 Hyperlipidemia, unspecified: Secondary | ICD-10-CM | POA: Diagnosis not present

## 2022-06-22 DIAGNOSIS — I340 Nonrheumatic mitral (valve) insufficiency: Secondary | ICD-10-CM

## 2022-06-22 DIAGNOSIS — Z7901 Long term (current) use of anticoagulants: Secondary | ICD-10-CM | POA: Diagnosis not present

## 2022-06-22 DIAGNOSIS — D6869 Other thrombophilia: Secondary | ICD-10-CM | POA: Diagnosis not present

## 2022-06-22 DIAGNOSIS — I4819 Other persistent atrial fibrillation: Secondary | ICD-10-CM | POA: Diagnosis present

## 2022-06-22 HISTORY — PX: CARDIOVERSION: SHX1299

## 2022-06-22 LAB — POCT I-STAT, CHEM 8
BUN: 14 mg/dL (ref 8–23)
Calcium, Ion: 1.29 mmol/L (ref 1.15–1.40)
Chloride: 101 mmol/L (ref 98–111)
Creatinine, Ser: 0.7 mg/dL (ref 0.44–1.00)
Glucose, Bld: 91 mg/dL (ref 70–99)
HCT: 36 % (ref 36.0–46.0)
Hemoglobin: 12.2 g/dL (ref 12.0–15.0)
Potassium: 4.5 mmol/L (ref 3.5–5.1)
Sodium: 139 mmol/L (ref 135–145)
TCO2: 32 mmol/L (ref 22–32)

## 2022-06-22 SURGERY — CARDIOVERSION
Anesthesia: General

## 2022-06-22 MED ORDER — SODIUM CHLORIDE 0.9 % IV SOLN: INTRAVENOUS | Status: DC

## 2022-06-22 MED ORDER — LIDOCAINE 2% (20 MG/ML) 5 ML SYRINGE
INTRAMUSCULAR | Status: DC | PRN
  Administered 2022-06-22: 60 mg via INTRAVENOUS

## 2022-06-22 MED ORDER — PROPOFOL 10 MG/ML IV BOLUS
INTRAVENOUS | Status: DC | PRN
  Administered 2022-06-22: 50 mg via INTRAVENOUS

## 2022-06-22 SURGICAL SUPPLY — 1 items: ELECT DEFIB PAD ADLT CADENCE (PAD) ×1 IMPLANT

## 2022-06-22 NOTE — Discharge Instructions (Signed)

## 2022-06-22 NOTE — Anesthesia Preprocedure Evaluation (Signed)
Anesthesia Evaluation  Patient identified by MRN, date of birth, ID band Patient awake    Reviewed: Allergy & Precautions, H&P , NPO status , Patient's Chart, lab work & pertinent test results  Airway Mallampati: II   Neck ROM: full    Dental   Pulmonary neg pulmonary ROS   breath sounds clear to auscultation       Cardiovascular hypertension, + Peripheral Vascular Disease  + dysrhythmias Atrial Fibrillation + Valvular Problems/Murmurs MR  Rhythm:irregular Rate:Normal     Neuro/Psych    GI/Hepatic   Endo/Other    Renal/GU      Musculoskeletal   Abdominal   Peds  Hematology   Anesthesia Other Findings   Reproductive/Obstetrics                             Anesthesia Physical Anesthesia Plan  ASA: 3  Anesthesia Plan: General   Post-op Pain Management:    Induction: Intravenous  PONV Risk Score and Plan: 3 and Propofol infusion and Treatment may vary due to age or medical condition  Airway Management Planned: Mask  Additional Equipment:   Intra-op Plan:   Post-operative Plan:   Informed Consent: I have reviewed the patients History and Physical, chart, labs and discussed the procedure including the risks, benefits and alternatives for the proposed anesthesia with the patient or authorized representative who has indicated his/her understanding and acceptance.     Dental advisory given  Plan Discussed with: CRNA, Anesthesiologist and Surgeon  Anesthesia Plan Comments:        Anesthesia Quick Evaluation

## 2022-06-22 NOTE — Transfer of Care (Signed)
Immediate Anesthesia Transfer of Care Note  Patient: Anna Roy  Procedure(s) Performed: CARDIOVERSION  Patient Location: Cath Lab  Anesthesia Type:MAC  Level of Consciousness: drowsy  Airway & Oxygen Therapy: Patient Spontanous Breathing and Patient connected to nasal cannula oxygen  Post-op Assessment: Report given to RN and Post -op Vital signs reviewed and stable  Post vital signs: Reviewed and stable  Last Vitals:  Vitals Value Taken Time  BP 116/81   Temp 98   Pulse 50 06/22/22 0919  Resp 16 06/22/22 0920  SpO2 96 % 06/22/22 0919  Vitals shown include unvalidated device data.  Last Pain:  Vitals:   06/22/22 0903  TempSrc:   PainSc: 0-No pain         Complications: No notable events documented.

## 2022-06-22 NOTE — Interval H&P Note (Signed)
History and Physical Interval Note:  06/22/2022 9:05 AM  Anna Roy  has presented today for surgery, with the diagnosis of afib.  The various methods of treatment have been discussed with the patient and family. After consideration of risks, benefits and other options for treatment, the patient has consented to  Procedure(s): CARDIOVERSION (N/A) as a surgical intervention.  The patient's history has been reviewed, patient examined, no change in status, stable for surgery.  I have reviewed the patient's chart and labs.  Questions were answered to the patient's satisfaction.     [employer]

## 2022-06-22 NOTE — CV Procedure (Signed)
    Electrical Cardioversion Procedure Note Anna Roy [id number] 18-Nov-1940  Procedure: Electrical Cardioversion Indications:  Atrial Fibrillation  Time Out: Verified patient identification, verified procedure,medications/allergies/relevent history reviewed, required imaging and test results available.  Performed  Procedure Details  The patient was NPO after midnight. Anesthesia was administered at the beside  by Dr.Hodierne with propofol.  Cardioversion was performed with synchronized biphasic defibrillation via AP pads with 200 joules.  2 attempt(s) were performed.  After the first attempt, she did convert to sinus bradycardia but then after approximately 1 minute reverted back to atrial fibrillation.  A second attempt was made and she converted to sinus bradycardia once again.  The patient converted to normal sinus rhythm/sinus bradycardia. The patient tolerated the procedure well   IMPRESSION:  Successful cardioversion of atrial fibrillation after 2 attempts.  First attempt resulted in sinus bradycardia for approximately 1 minute, second attempt at this point of construction of note was successful.  Sinus bradycardia.    Donato Schultz 06/22/2022, 9:29 AM

## 2022-06-23 ENCOUNTER — Encounter (HOSPITAL_COMMUNITY): Payer: Self-pay | Admitting: Cardiology

## 2022-06-26 NOTE — Anesthesia Postprocedure Evaluation (Signed)
Anesthesia Post Note  Patient: Arboriculturist  Procedure(s) Performed: CARDIOVERSION     Patient location during evaluation: Cath Lab Anesthesia Type: General Level of consciousness: awake and alert Pain management: pain level controlled Vital Signs Assessment: post-procedure vital signs reviewed and stable Respiratory status: spontaneous breathing, nonlabored ventilation, respiratory function stable and patient connected to nasal cannula oxygen Cardiovascular status: blood pressure returned to baseline and stable Postop Assessment: no apparent nausea or vomiting Anesthetic complications: no   No notable events documented.  Last Vitals:  Vitals:   06/22/22 0950 06/22/22 1000  BP: 115/76 116/70  Pulse: (!) 52 (!) 52  Resp: (!) 9 10  Temp:    SpO2: 97% 96%    Last Pain:  Vitals:   06/22/22 0903  TempSrc:   PainSc: 0-No pain                 Navpreet Szczygiel S

## 2022-07-14 ENCOUNTER — Ambulatory Visit: Payer: Medicare Other | Attending: Cardiovascular Disease | Admitting: Cardiovascular Disease

## 2022-07-14 ENCOUNTER — Encounter: Payer: Self-pay | Admitting: Cardiovascular Disease

## 2022-07-14 VITALS — BP 124/70 | HR 83 | Ht 64.0 in | Wt 150.0 lb

## 2022-07-14 DIAGNOSIS — I4819 Other persistent atrial fibrillation: Secondary | ICD-10-CM

## 2022-07-14 NOTE — Patient Instructions (Signed)
Medication Instructions:  Your physician recommends that you continue on your current medications as directed. Please refer to the Current Medication list given to you today. *If you need a refill on your cardiac medications before your next appointment, please call your pharmacy*   Follow-Up: At [facility], you and your health needs are our priority.  As part of our continuing mission to provide you with exceptional heart care, we have created designated Provider Care Teams.  These Care Teams include your primary Cardiologist (physician) and Advanced Practice Providers (APPs -  Physician Assistants and Nurse Practitioners) who all work together to provide you with the care you need, when you need it.  We recommend signing up for the patient portal called "MyChart".  Sign up information is provided on this After Visit Summary.  MyChart is used to connect with patients for Virtual Visits (Telemedicine).  Patients are able to view lab/test results, encounter notes, upcoming appointments, etc.  Non-urgent messages can be sent to your provider as well.   To learn more about what you can do with MyChart, go to [email or web address].    Your next appointment:   1 year(s)  Provider:   Augustus Mealor, MD  

## 2022-07-14 NOTE — Progress Notes (Signed)
Electrophysiology Office Note:    Date:  07/14/2022   ID:  Anna Roy, DOB Oct 25, 1940, MRN [id number]  PCP:  Caffie Damme, MD   [facility] Providers Cardiologist:  None Electrophysiologist:  Maurice Small, MD     Referring MD: Caffie Damme, MD   History of Present Illness:    Anna Roy is a 82 y.o. female with a hx listed below, significant for hypertension and hyperlipidemia, referred for arrhythmia management.  She was seen by her PCP for an annual checkup in January.  EKG at that time showed atrial fibrillation.  She recalls it had been approximately a year since her prior physician visit.  Patient was referred to cardiology and saw Dr. Hanley Hays with [facility] integrative cardiology in [facility].  Performed an echocardiogram, month-long monitor, and stress test.  There was no underlying structural heart disease detected other than that her left atrium is severely dilated.  I do not have the final report from the monitor, just a few transmissions with brief pauses in atrial fibrillation.  The patient is completely unaware of her atrial fibrillation.  She has not noticed onset of or increased fatigue.  She does not have palpitations.  She has not had any episodes of lightheadedness or presyncope.  She underwent DCCV on June 6.  She reports that she did not feel any different after the cardioversion --no improvement in energy levels, breathing.  She is not aware that she is back in atrial fibrillation today.   Past Medical History:  Diagnosis Date   Abdominal pain    Abnormal EKG    Abnormal thyroid function test    Abnormal urine    Acute sphenoidal sinusitis, recurrence not specified    Allergic rhinitis due to pollen, unspecified seasonality    Atrial fibrillation by electrocardiogram (HCC)    BMI 26.0-26.9,adult    BPPV (benign paroxysmal positional vertigo)    Bronchitis    Bruit    Candidal intertrigo    Cardiomegaly    Carotid stenosis, bilateral     Chest pain    Cough    Dermatitis    Dizziness and giddiness    Dysuria    Fatigue    Hematuria    History of influenza    HTN (hypertension)    Hypercholesteremia    Hypomagnesemia    Knee pain, chronic    Leg pain, bilateral    LVH (left ventricular hypertrophy)    Mitral regurgitation    Mixed hyperlipidemia    Osteopenia    Osteoporosis    Overweight    Palpitation    Prediabetes    Pruritus    PVC (premature ventricular contraction)    PVD (peripheral vascular disease) (HCC)    Rash    SOB (shortness of breath) on exertion    Urinary frequency    Vitamin D deficiency    Vitamin D deficiency     Past Surgical History:  Procedure Laterality Date   CARDIOVERSION N/A 06/22/2022   Procedure: CARDIOVERSION;  Surgeon: Jake Bathe, MD;  Location: [facility] INVASIVE CV LAB;  Service: Cardiovascular;  Laterality: N/A;   CARPAL TUNNEL RELEASE     CHOLECYSTECTOMY     SHOULDER SURGERY     TUBAL LIGATION      Current Medications: No outpatient medications have been marked as taking for the 07/14/22 encounter (Appointment) with Coty Student, Roberts Gaudy, MD.     Allergies:   Patient has no known allergies.   Social and Family History: Reviewed  in Epic  ROS:   Please see the history of present illness.    All other systems reviewed and are negative.  EKGs/Labs/Other Studies Reviewed Today:    Echocardiogram:  TTE - per [facility] referral records EF 65-70%, left atrium is markedly dilated, 5.1 cm, otherwise normal anatomy and function   Monitors:  MCT  2/19-3/19, 2024 The patient has brought print outs of a few critical events from the monitor that show AF and pauses of up to 3.5 seconds occurring in the AM; one occurred at 7:45. The others before 5 AM.  Stress testing:   Advanced imaging:    EKG:  Last EKG results: today - atrial fibrillation with controlled ventricular rates   Recent Labs: 05/17/2022: Platelets 232 06/22/2022: BUN 14; Creatinine, Ser  0.70; Hemoglobin 12.2; Potassium 4.5; Sodium 139     Physical Exam:    VS:  There were no vitals taken for this visit.    Wt Readings from Last 3 Encounters:  06/22/22 150 lb (68 kg)  06/16/22 150 lb 12.8 oz (68.4 kg)  05/17/22 151 lb 3.2 oz (68.6 kg)     GEN: Well nourished, well developed in no acute distress CARDIAC: iRRR, no murmurs, rubs, gallops RESPIRATORY:  Normal work of breathing MUSCULOSKELETAL: no edema    ASSESSMENT & PLAN:    Atrial fibrillation - permanent No palpitations.  Rates are controlled Atrium is markedly dilated; AF is fairly fine on ECG I suspect she has had AF for much longer than has been appreciated She had early recurrence of atrial fibrillation after cardioversion and is unaware of whether she is in normal rhythm or atrial fibrillation.  I do not think, particular given her advanced age, that rhythm control is warranted  Secondary hypercoagulable state Eliquis 5mg    Brief pauses Pauses in AF are < 6 seconds, and the patient has not had symptoms of bradycardia No indication for pacemaker at this time.       Medication Adjustments/Labs and Tests Ordered: Current medicines are reviewed at length with the patient today.  Concerns regarding medicines are outlined above.  No orders of the defined types were placed in this encounter.  No orders of the defined types were placed in this encounter.    Signed, Maurice Small, MD  07/14/2022 1:03 PM    [facility]

## 2022-12-12 ENCOUNTER — Telehealth: Payer: Self-pay

## 2022-12-12 ENCOUNTER — Telehealth: Payer: Self-pay | Admitting: Cardiovascular Disease

## 2022-12-12 ENCOUNTER — Telehealth: Payer: Self-pay | Admitting: *Deleted

## 2022-12-12 ENCOUNTER — Ambulatory Visit: Payer: Medicare Other | Attending: Physician Assistant

## 2022-12-12 DIAGNOSIS — Z01810 Encounter for preprocedural cardiovascular examination: Secondary | ICD-10-CM

## 2022-12-12 NOTE — Telephone Encounter (Signed)
Patient with diagnosis of afib on Eliquis for anticoagulation.    Procedure:  Left Total Shoulder Arthroplasty  Date of procedure: 12/20/22   CHA2DS2-VASc Score = 6   This indicates a 9.7% annual risk of stroke. The patient's score is based upon: CHF History: 1 HTN History: 1 Diabetes History: 0 Stroke History: 0 Vascular Disease History: 1 Age Score: 2 Gender Score: 1      CrCl 78 ml/min Platelet count 319  Per office protocol, patient can hold Eliquis for 3 days prior to procedure.    **This guidance is not considered finalized until pre-operative APP has relayed final recommendations.**

## 2022-12-12 NOTE — Telephone Encounter (Signed)
I tried to call the pt again as it was coming to the end of the day to see if we could her tele appt on for today. I left message that she will need to call to set up tele though not sure if this will be able to be done today depending on what time she calls back.

## 2022-12-12 NOTE — Telephone Encounter (Signed)
Left message to call back ASAP so that we can add her on for tele appt today per Jari Favre, PAC. I will send FYI note to requesting office that we are trying to reach the pt today so we can add her on today for tele pre op appt.

## 2022-12-12 NOTE — Telephone Encounter (Signed)
   Name: Anna Roy  DOB: [birth year]  MRN: [id number]  Primary Cardiologist: None  Chart reviewed as part of pre-operative protocol coverage. Because of [name]'s past medical history and time since last visit, she will require a follow-up telephone visit in order to better assess preoperative cardiovascular risk.  Pre-op covering staff: - Please schedule appointment and call patient to inform them. If patient already had an upcoming appointment within acceptable timeframe, please add "pre-op clearance" to the appointment notes so provider is aware. - Please contact requesting surgeon's office via preferred method (i.e, phone, fax) to inform them of need for appointment prior to surgery.  Per office protocol, patient can hold Eliquis for 3 days prior to procedure.  Please resume when medically safe to do so.    Sharlene Dory, PA-C  12/12/2022, 1:57 PM

## 2022-12-12 NOTE — Telephone Encounter (Signed)
Pt called back and has been added to pre op schedule per Jari Favre, PAC. Consent is done.

## 2022-12-12 NOTE — Telephone Encounter (Signed)
  Patient Consent for Virtual Visit        Anna Roy has provided verbal consent on 12/12/2022 for a virtual visit (video or telephone).   CONSENT FOR VIRTUAL VISIT FOR:  Anna Roy  By participating in this virtual visit I agree to the following:  I hereby voluntarily request, consent and authorize [facility] and its employed or contracted physicians, physician assistants, nurse practitioners or other licensed health care professionals (the Practitioner), to provide me with telemedicine health care services (the "Services") as deemed necessary by the treating Practitioner. I acknowledge and consent to receive the Services by the Practitioner via telemedicine. I understand that the telemedicine visit will involve communicating with the Practitioner through live audiovisual communication technology and the disclosure of certain medical information by electronic transmission. I acknowledge that I have been given the opportunity to request an in-person assessment or other available alternative prior to the telemedicine visit and am voluntarily participating in the telemedicine visit.  I understand that I have the right to withhold or withdraw my consent to the use of telemedicine in the course of my care at any time, without affecting my right to future care or treatment, and that the Practitioner or I may terminate the telemedicine visit at any time. I understand that I have the right to inspect all information obtained and/or recorded in the course of the telemedicine visit and may receive copies of available information for a reasonable fee.  I understand that some of the potential risks of receiving the Services via telemedicine include:  Delay or interruption in medical evaluation due to technological equipment failure or disruption; Information transmitted may not be sufficient (e.g. poor resolution of images) to allow for appropriate medical decision making by the Practitioner;  and/or  In rare instances, security protocols could fail, causing a breach of personal health information.  Furthermore, I acknowledge that it is my responsibility to provide information about my medical history, conditions and care that is complete and accurate to the best of my ability. I acknowledge that Practitioner's advice, recommendations, and/or decision may be based on factors not within their control, such as incomplete or inaccurate data provided by me or distortions of diagnostic images or specimens that may result from electronic transmissions. I understand that the practice of medicine is not an exact science and that Practitioner makes no warranties or guarantees regarding treatment outcomes. I acknowledge that a copy of this consent can be made available to me via my patient portal [facility] MyChart), or I can request a printed copy by calling the office of [facility].    I understand that my insurance will be billed for this visit.   I have read or had this consent read to me. I understand the contents of this consent, which adequately explains the benefits and risks of the Services being provided via telemedicine.  I have been provided ample opportunity to ask questions regarding this consent and the Services and have had my questions answered to my satisfaction. I give my informed consent for the services to be provided through the use of telemedicine in my medical care

## 2022-12-12 NOTE — Telephone Encounter (Signed)
   Pre-operative Risk Assessment    Patient Name: Anna Roy  DOB: 07/03/1940 MRN: [id number]{  Last ov: 07/14/22 with Dr. Nelly Laurence Next ov: None     Request for Surgical Clearance{     Procedure:   Left Total Shoulder Arthroplasty  Date of Surgery:  Clearance 12/20/22                                 Surgeon:  Dr. Thamas Jaegers    Surgeon's Group or Practice Name:  [facility] Phone number:  [phone] Fax number:  [phone]   Type of Clearance Requested:   - Medical  - Pharmacy:  Hold Apixaban (Eliquis) Not indicated   Type of Anesthesia:  General /ISB   Additional requests/questions:    Robley Fries   12/12/2022, 9:57 AM

## 2022-12-12 NOTE — Telephone Encounter (Signed)
Patient returned Pre-op call. 

## 2022-12-12 NOTE — Progress Notes (Signed)
Virtual Visit via Telephone Note   Because of Adie Hostetter's co-morbid illnesses, she is at least at moderate risk for complications without adequate follow up.  This format is felt to be most appropriate for this patient at this time.  The patient did not have access to video technology/had technical difficulties with video requiring transitioning to audio format only (telephone).  All issues noted in this document were discussed and addressed.  No physical exam could be performed with this format.  Please refer to the patient's chart for her consent to telehealth for [facility].  Evaluation Performed:  Preoperative cardiovascular risk assessment _____________   Date:  12/12/2022   Patient ID:  Ronneka Kelch, DOB 08-19-1940, MRN [id number] Patient Location:  Home Provider location:   Office  Primary Care Provider:  Caffie Damme, MD Primary Cardiologist:  None  Chief Complaint / Patient Profile   82 y.o. y/o female with a h/o hypertension, hyperlipidemia, atrial fibrillation who is pending shoulder replacement and presents today for telephonic preoperative cardiovascular risk assessment.  History of Present Illness    Derry Bellan is a 82 y.o. female who presents via audio/video conferencing for a telehealth visit today.  Pt was last seen in cardiology clinic on 07/14/2022 by Dr. Nelly Laurence.  At that time [facility] was doing well .  The patient is now pending procedure as outlined above. Since her last visit, she tells me that she has not had any chest pain or shortness of breath.  She has however been spitting up phlegm since her preop appointment.  She states that they swabbed her nose for something and since then she has been having copious amounts of drainage.  No heart related symptoms to her knowledge.  Arms are hurting her which needs to be fixed with surgery.  She washes her own close and lives by herself.  Both daughters are local and visit her quite frequently.  She has a  lift chair which helps her get in and out of bed.  She cooks for herself and is independent.  For this reason she does meet 4 METS on the DASI.  Some confusion today about the purpose of this phone call.  Per office protocol, patient can hold Eliquis for 3 days prior to procedure.  She can resume when medically safe to do so.  Reports no shortness of breath nor dyspnea on exertion. Reports no chest pain, pressure, or tightness. No edema, orthopnea, PND. Reports no palpitations.   Past Medical History    Past Medical History:  Diagnosis Date   Abdominal pain    Abnormal EKG    Abnormal thyroid function test    Abnormal urine    Acute sphenoidal sinusitis, recurrence not specified    Allergic rhinitis due to pollen, unspecified seasonality    Atrial fibrillation by electrocardiogram (HCC)    BMI 26.0-26.9,adult    BPPV (benign paroxysmal positional vertigo)    Bronchitis    Bruit    Candidal intertrigo    Cardiomegaly    Carotid stenosis, bilateral    Chest pain    Cough    Dermatitis    Dizziness and giddiness    Dysuria    Fatigue    Hematuria    History of influenza    HTN (hypertension)    Hypercholesteremia    Hypomagnesemia    Knee pain, chronic    Leg pain, bilateral    LVH (left ventricular hypertrophy)    Mitral regurgitation    Mixed  hyperlipidemia    Osteopenia    Osteoporosis    Overweight    Palpitation    Prediabetes    Pruritus    PVC (premature ventricular contraction)    PVD (peripheral vascular disease) (HCC)    Rash    SOB (shortness of breath) on exertion    Urinary frequency    Vitamin D deficiency    Vitamin D deficiency    Past Surgical History:  Procedure Laterality Date   CARDIOVERSION N/A 06/22/2022   Procedure: CARDIOVERSION;  Surgeon: Jake Bathe, MD;  Location: [facility] INVASIVE CV LAB;  Service: Cardiovascular;  Laterality: N/A;   CARPAL TUNNEL RELEASE     CHOLECYSTECTOMY     SHOULDER SURGERY     TUBAL LIGATION       Allergies  No Known Allergies  Home Medications    Prior to Admission medications   Medication Sig Start Date End Date Taking? Authorizing Provider  apixaban (ELIQUIS) 5 MG TABS tablet Take 1 tablet (5 mg total) by mouth 2 (two) times daily. 05/19/22   Mealor, Roberts Gaudy, MD  atorvastatin (LIPITOR) 20 MG tablet Take 20 mg by mouth daily in the afternoon. 1400 05/23/14   [provider]  Cholecalciferol (VITAMIN D-1000 MAX ST) 25 MCG (1000 UT) tablet Take 1,000 Units by mouth daily.    [provider]  diclofenac Sodium (VOLTAREN ARTHRITIS PAIN) 1 % GEL Apply 2 g topically daily.    [provider]  fluticasone (FLONASE) 50 MCG/ACT nasal spray Place 1 spray into both nostrils daily as needed for allergies.    [provider]  HYDROcodone-acetaminophen (NORCO) 10-325 MG tablet Take 1 tablet by mouth 2 (two) times daily. Addition if needed when active    [provider]  lisinopril-hydrochlorothiazide (ZESTORETIC) 20-12.5 MG tablet Take 1 tablet by mouth daily. 09/04/18   [provider]  Propylene Glycol, PF, (SYSTANE COMPLETE PF) 0.6 % SOLN Place 1 drop into both eyes daily as needed (Dry eyes).    [provider]    Physical Exam    Vital Signs:  Maaliyah Alloy does not have vital signs available for review today.  Given telephonic nature of communication, physical exam is limited. AAOx3. NAD. Normal affect.  Speech and respirations are unlabored.  Accessory Clinical Findings    None  Assessment & Plan    1.  Preoperative Cardiovascular Risk Assessment:  Ms. Tonelli's perioperative risk of a major cardiac event is 0.4% according to the Revised Cardiac Risk Index (RCRI).  Therefore, she is at low risk for perioperative complications.   Her functional capacity is fair at 4.4 METs according to the [facility] Activity Status Index (DASI). Recommendations: According to ACC/AHA guidelines, no further cardiovascular testing needed.   The patient may proceed to surgery at acceptable risk.   Antiplatelet and/or Anticoagulation Recommendations:  Eliquis (Apixaban) can be held for 3 days prior to surgery.  Please resume post op when felt to be safe.     The patient was advised that if she develops new symptoms prior to surgery to contact our office to arrange for a follow-up visit, and she verbalized understanding.  A copy of this note will be routed to requesting surgeon.  Time:   Today, I have spent 12 minutes with the patient with telehealth technology discussing medical history, symptoms, and management plan.     Sharlene Dory, PA-C  12/12/2022, 4:23 PM

## 2022-12-18 ENCOUNTER — Telehealth: Payer: Self-pay | Admitting: Cardiovascular Disease

## 2022-12-18 NOTE — Telephone Encounter (Signed)
I have reviewed the chart and looks like the pt has been cleared by Jari Favre, [facility] 12/12/22. I will have our pre op APP review to be sure the pt has been cleared. If so I will then re-fax notes providing clearance.

## 2022-12-18 NOTE — Telephone Encounter (Signed)
  Larita Fife from [facility] is calling to follow up on the clearance due to surgery being scheduled for tomorrow. Please advise
